# Patient Record
Sex: Male | Born: 1980 | Race: White | Hispanic: No | Marital: Married | Smoking: Current some day smoker
Health system: Southern US, Community
[De-identification: ages and names within clinical notes are randomized; demographics above are authoritative.]

## PROBLEM LIST (undated history)

## (undated) DIAGNOSIS — T7840XA Allergy, unspecified, initial encounter: Secondary | ICD-10-CM

## (undated) DIAGNOSIS — B019 Varicella without complication: Secondary | ICD-10-CM

## (undated) HISTORY — DX: Allergy, unspecified, initial encounter: T78.40XA

## (undated) HISTORY — DX: Varicella without complication: B01.9

---
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
## 2002-01-14 ENCOUNTER — Emergency Department (HOSPITAL_COMMUNITY): Admission: EM | Admit: 2002-01-14 | Discharge: 2002-01-14 | Payer: Self-pay | Admitting: Emergency Medicine

## 2003-12-06 ENCOUNTER — Emergency Department (HOSPITAL_COMMUNITY): Admission: EM | Admit: 2003-12-06 | Discharge: 2003-12-06 | Payer: Self-pay | Admitting: Emergency Medicine

## 2004-04-04 ENCOUNTER — Emergency Department (HOSPITAL_COMMUNITY): Admission: EM | Admit: 2004-04-04 | Discharge: 2004-04-04 | Payer: Self-pay | Admitting: Emergency Medicine

## 2006-06-07 ENCOUNTER — Emergency Department (HOSPITAL_COMMUNITY): Admission: EM | Admit: 2006-06-07 | Discharge: 2006-06-08 | Payer: Self-pay | Admitting: Emergency Medicine

## 2007-10-27 ENCOUNTER — Emergency Department (HOSPITAL_COMMUNITY): Admission: EM | Admit: 2007-10-27 | Discharge: 2007-10-27 | Payer: Self-pay | Admitting: Emergency Medicine

## 2008-05-05 ENCOUNTER — Emergency Department (HOSPITAL_COMMUNITY): Admission: EM | Admit: 2008-05-05 | Discharge: 2008-05-05 | Payer: Self-pay | Admitting: Emergency Medicine
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
## 2011-08-28 ENCOUNTER — Emergency Department (HOSPITAL_COMMUNITY)
Admission: EM | Admit: 2011-08-28 | Discharge: 2011-08-28 | Discharge status: Against Medical Advice | Payer: Self-pay | Attending: Emergency Medicine | Admitting: Emergency Medicine

## 2011-08-28 ENCOUNTER — Encounter (HOSPITAL_COMMUNITY): Payer: Self-pay | Admitting: *Deleted

## 2011-08-28 ENCOUNTER — Emergency Department (HOSPITAL_COMMUNITY)
Admission: EM | Admit: 2011-08-28 | Discharge: 2011-08-28 | Disposition: A | Discharge status: Home / Self Care | Payer: Medicaid Other | Attending: Emergency Medicine | Admitting: Emergency Medicine

## 2011-08-28 ENCOUNTER — Encounter (HOSPITAL_COMMUNITY): Payer: Self-pay

## 2011-08-28 DIAGNOSIS — K0381 Cracked tooth: Secondary | ICD-10-CM | POA: Insufficient documentation

## 2011-08-28 DIAGNOSIS — K089 Disorder of teeth and supporting structures, unspecified: Secondary | ICD-10-CM | POA: Insufficient documentation

## 2011-08-28 DIAGNOSIS — K0889 Other specified disorders of teeth and supporting structures: Secondary | ICD-10-CM

## 2011-08-28 MED ORDER — OXYCODONE-ACETAMINOPHEN 5-325 MG PO TABS: 1.0000 | ORAL_TABLET | ORAL | Status: AC | PRN

## 2011-08-28 MED ORDER — BUPIVACAINE HCL 0.25 % IJ SOLN
10.0000 mL | Freq: Once | INTRAMUSCULAR | Status: AC
  Administered 2011-08-28: 1 mL

## 2011-08-28 MED ORDER — IBUPROFEN 400 MG PO TABS
400.0000 mg | ORAL_TABLET | Freq: Once | ORAL | Status: AC
  Administered 2011-08-28: 400 mg via ORAL
  Filled 2011-08-28: qty 1

## 2011-08-28 MED ORDER — NAPROXEN 500 MG PO TABS: 500.0000 mg | ORAL_TABLET | Freq: Two times a day (BID) | ORAL | Status: AC | PRN

## 2011-08-28 NOTE — ED Notes (Signed)
Pt called to take to a treatment room  No response from lobby  Registration states person walked outside a while ago and never returned

## 2011-08-28 NOTE — ED Provider Notes (Signed)
History    This chart was scribed for Adam Razor, MD, MD by Smitty Pluck. The patient was seen in room TR07C and the patient's care was started at 12:34PM.   CSN: [id number]  Arrival date & time 08/28/11  1219   None     No chief complaint on file.   (Consider location/radiation/quality/duration/timing/severity/associated sxs/prior treatment) The history is provided by the patient.   Adam Kennedy is a 31 y.o. male who presents to the Emergency Department complaining of constant, moderate right lower dental pain onset 1 week ago. Pt reports that he has decayed tooth and wisdom tooth coming in. Denies trouble breathing and swallowing. Reports pain is radiating to right jaw and right ear. Pt has taken OTC pain medication without relief. Pt reports that he is the process of finding a dentist.   No past medical history on file.  No past surgical history on file.  No family history on file.  History  Substance Use Topics  . Smoking status: Current Everyday Smoker  . Smokeless tobacco: Not on file  . Alcohol Use: Yes      Review of Systems  All other systems reviewed and are negative.  10 Systems reviewed and all are negative for acute change except as noted in the HPI.   Allergies  Review of patient's allergies indicates no known allergies.  Home Medications  No current outpatient prescriptions on file.  BP 155/102  Pulse 75  Temp 97.7 F (36.5 C) (Oral)  Resp 20  Ht 6' (1.829 m)  Wt 145 lb (65.772 kg)  BMI 19.67 kg/m2  SpO2 98%  Physical Exam  Nursing note and vitals reviewed. Constitutional: He appears well-developed and well-nourished. No distress.  HENT:  Head: Normocephalic and atraumatic.  Mouth/Throat: Oropharynx is clear and moist.       Right lower wisdom tooth is impacted  Mild gingival hyperplasia without abscess Dental caries to right lower molars     Eyes: Conjunctivae are normal. Right eye exhibits no discharge. Left eye exhibits no  discharge.  Neck: Neck supple.  Cardiovascular: Normal rate, regular rhythm and normal heart sounds.  Exam reveals no gallop and no friction rub.   No murmur heard. Pulmonary/Chest: Effort normal and breath sounds normal. No respiratory distress.       No stridor   Abdominal: Soft. He exhibits no distension. There is no tenderness.  Musculoskeletal: He exhibits no edema and no tenderness.  Lymphadenopathy:    He has no cervical adenopathy.  Neurological: He is alert.  Skin: Skin is warm and dry.  Psychiatric: He has a normal mood and affect. His behavior is normal. Thought content normal.    ED Course  NERVE BLOCK Date/Time: 08/28/2011 4:45 AM Performed by: Adam Kennedy Authorized by: Adam Kennedy Consent: Verbal consent obtained. Risks and benefits: risks, benefits and alternatives were discussed Consent given by: patient Indications: pain relief Body area: face/mouth Nerve: inferior alveolar Laterality: right Patient sedated: no Needle gauge: 27 G Location technique: anatomical landmarks Local anesthetic: bupivacaine 0.5% without epinephrine Anesthetic total: 2 ml Outcome: pain improved Patient tolerance: Patient tolerated the procedure well with no immediate complications.   (including critical care time) DIAGNOSTIC STUDIES: Oxygen Saturation is 98% on room air, normal by my interpretation.    COORDINATION OF CARE: 12:42PM EDP discusses pt ED treatment with pt  12:42PM EDP orders medication: Scheduled Meds:   . bupivacaine  10 mL Infiltration Once  . ibuprofen  400 mg Oral Once   Continuous Infusions:  PRN Meds:.    Labs Reviewed - No data to display No results found.   1. Pain, dental       MDM  30yM with dental pain w/o evidence of infection. Plan symptomatic control. Dental resource list provided. outpt fu.      I personally preformed the services scribed in my presence. The recorded information has been reviewed and considered. Adam Razor, MD.    Adam Razor, MD 09/06/11 [phone]

## 2011-08-28 NOTE — ED Notes (Signed)
MD at bedside. 

## 2011-08-28 NOTE — ED Notes (Signed)
Toothache to right lower jaw for about a week now. Broken molar and wisdom tooth coming in per patient. Outer portion of molar broken.

## 2011-08-28 NOTE — ED Notes (Signed)
Pt presents to the ED with c/o toothache to right lower molar. Pt also c/o ear pain, and head on the right side of head. Pt denies fever, n/v/d, cough, or sore throat. Pt states he has wisdom teeth that are also coming in.

## 2014-11-23 ENCOUNTER — Encounter: Payer: Self-pay | Admitting: Internal Medicine

## 2014-11-23 ENCOUNTER — Ambulatory Visit (INDEPENDENT_AMBULATORY_CARE_PROVIDER_SITE_OTHER): Payer: 59 | Admitting: Internal Medicine

## 2014-11-23 VITALS — BP 142/90 | HR 75 | Temp 98.4°F | Ht 69.5 in | Wt 146.0 lb

## 2014-11-23 DIAGNOSIS — F39 Unspecified mood [affective] disorder: Secondary | ICD-10-CM

## 2014-11-23 DIAGNOSIS — J302 Other seasonal allergic rhinitis: Secondary | ICD-10-CM

## 2014-11-23 NOTE — Progress Notes (Signed)
Pre visit review using our clinic review tool, if applicable. No additional management support is needed unless otherwise documented below in the visit note. 

## 2014-11-23 NOTE — Patient Instructions (Signed)
Generalized Anxiety Disorder Generalized anxiety disorder (GAD) is a mental disorder. It interferes with life functions, including relationships, work, and school. GAD is different from normal anxiety, which everyone experiences at some point in their lives in response to specific life events and activities. Normal anxiety actually helps us prepare for and get through these life events and activities. Normal anxiety goes away after the event or activity is over.  GAD causes anxiety that is not necessarily related to specific events or activities. It also causes excess anxiety in proportion to specific events or activities. The anxiety associated with GAD is also difficult to control. GAD can vary from mild to severe. People with severe GAD can have intense waves of anxiety with physical symptoms (panic attacks).  SYMPTOMS The anxiety and worry associated with GAD are difficult to control. This anxiety and worry are related to many life events and activities and also occur more days than not for 6 months or longer. People with GAD also have three or more of the following symptoms (one or more in children):  Restlessness.   Fatigue.  Difficulty concentrating.   Irritability.  Muscle tension.  Difficulty sleeping or unsatisfying sleep. DIAGNOSIS GAD is diagnosed through an assessment by your health care provider. Your health care provider will ask you questions aboutyour mood,physical symptoms, and events in your life. Your health care provider may ask you about your medical history and use of alcohol or drugs, including prescription medicines. Your health care provider may also do a physical exam and blood tests. Certain medical conditions and the use of certain substances can cause symptoms similar to those associated with GAD. Your health care provider may refer you to a mental health specialist for further evaluation. TREATMENT The following therapies are usually used to treat GAD:    Medication. Antidepressant medication usually is prescribed for long-term daily control. Antianxiety medicines may be added in severe cases, especially when panic attacks occur.   Talk therapy (psychotherapy). Certain types of talk therapy can be helpful in treating GAD by providing support, education, and guidance. A form of talk therapy called cognitive behavioral therapy can teach you healthy ways to think about and react to daily life events and activities.  Stress managementtechniques. These include yoga, meditation, and exercise and can be very helpful when they are practiced regularly. A mental health specialist can help determine which treatment is best for you. Some people see improvement with one therapy. However, other people require a combination of therapies.   This information is not intended to replace advice given to you by your health care provider. Make sure you discuss any questions you have with your health care provider.   Document Released: 05/12/2012 Document Revised: 02/05/2014 Document Reviewed: 05/12/2012 Elsevier Interactive Patient Education 2016 [employer]  

## 2014-11-23 NOTE — Progress Notes (Signed)
HPI  Pt presents to the clinic today to establish care and for management of the conditions listed below. He has not had a PCP in many years.  He reports he is very irritable and has anger issues. He reports this has been an ongoing issue, > 10 years ago. He feels on edge all the time and feels like he may "go off" at any moment. He reports anxiety but denies depression. He denies SI/HI. He has never been treated for this in the past. He is concerned that he may be bipolar. He has not family history of documented mental illness.  Seasonal allergies: Worse in the Spring and Fall. He takes Claritin as needed.  Flu: never Tetanus: 2013 Dentist: as needed  Past Medical History  Diagnosis Date  . Chicken pox   . Allergy     No current outpatient prescriptions on file.   No current facility-administered medications for this visit.    No Known Allergies  Family History  Problem Relation Age of Onset  . Hypertension Mother   . Colon cancer Father   . Lung cancer Father   . Arthritis Maternal Grandmother   . Hypertension Maternal Grandmother   . Arthritis Maternal Grandfather   . Arthritis Paternal Grandmother   . Arthritis Paternal Grandfather     Social History   Social History  . Marital Status: Married    Spouse Name: N/A  . Number of Children: N/A  . Years of Education: N/A   Occupational History  . Not on file.   Social History Main Topics  . Smoking status: Current Every Day Smoker -- 1.00 packs/day  . Smokeless tobacco: Not on file  . Alcohol Use: 0.0 oz/week    0 Standard drinks or equivalent per week     Comment: daily beer  . Drug Use: Yes    Special: Marijuana     Comment: occasional  . Sexual Activity: Not on file     Comment: 08/27/11   Other Topics Concern  . Not on file   Social History Narrative    ROS:  Constitutional: Denies fever, malaise, fatigue, headache or abrupt weight changes.  HEENT: Denies eye pain, eye redness, ear pain, ringing  in the ears, wax buildup, runny nose, nasal congestion, bloody nose, or sore throat. Respiratory: Denies difficulty breathing, shortness of breath, cough or sputum production.   Cardiovascular: Denies chest pain, chest tightness, palpitations or swelling in the hands or feet.  Neurological: Denies dizziness, difficulty with memory, difficulty with speech or problems with balance and coordination.  Psych: Pt reports anxiety. Denies depression, SI/HI.  No other specific complaints in a complete review of systems (except as listed in HPI above).  PE:  BP 142/90 mmHg  Pulse 75  Temp(Src) 98.4 F (36.9 C) (Oral)  Ht 5' 9.5" (1.765 m)  Wt 146 lb (66.225 kg)  BMI 21.26 kg/m2  SpO2 98% Wt Readings from Last 3 Encounters:  11/23/14 146 lb (66.225 kg)  08/28/11 145 lb (65.772 kg)  08/28/11 145 lb (65.772 kg)    General: Appears his stated age, well developed, well nourished in NAD. HEENT: Head: normal shape and size; Eyes: sclera white, no icterus, conjunctiva pink, PERRLA and EOMs intact; Ears: Tm's gray and intact, normal light reflex;Throat/Mouth: Teeth present, mucosa pink and moist, no lesions or ulcerations noted.  Cardiovascular: Tachycardic with normal rhythm. S1,S2 noted.  No murmur, rubs or gallops noted.  Pulmonary/Chest: Normal effort and positive vesicular breath sounds. No respiratory distress. No wheezes,  rales or ronchi noted.  Neurological: Alert and oriented.  Psychiatric: Mood and affect normal. Behavior is normal. Judgment and thought content normal.    Assessment and Plan:  Mood disorder:  Will refer to psychiatry for further evaluation and treatment  Seasonal allergies:  Continue Claritin prn  Make an appt for your annual exam

## 2015-01-12 ENCOUNTER — Encounter (HOSPITAL_COMMUNITY): Payer: Self-pay | Admitting: Psychiatry

## 2015-01-12 ENCOUNTER — Ambulatory Visit (INDEPENDENT_AMBULATORY_CARE_PROVIDER_SITE_OTHER): Payer: 59 | Admitting: Psychiatry

## 2015-01-12 ENCOUNTER — Encounter (INDEPENDENT_AMBULATORY_CARE_PROVIDER_SITE_OTHER): Payer: Self-pay

## 2015-01-12 VITALS — BP 136/80 | HR 64 | Ht 70.0 in | Wt 150.2 lb

## 2015-01-12 DIAGNOSIS — F319 Bipolar disorder, unspecified: Secondary | ICD-10-CM

## 2015-01-12 DIAGNOSIS — F1210 Cannabis abuse, uncomplicated: Secondary | ICD-10-CM

## 2015-01-12 MED ORDER — DIVALPROEX SODIUM ER 250 MG PO TB24: ORAL_TABLET | ORAL | Status: AC
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
## 2015-02-10 ENCOUNTER — Ambulatory Visit (HOSPITAL_COMMUNITY): Payer: Self-pay | Admitting: Psychiatry

## 2015-05-24 ENCOUNTER — Encounter: Payer: 59 | Admitting: Internal Medicine

## 2016-11-21 ENCOUNTER — Emergency Department (HOSPITAL_COMMUNITY)
Admission: EM | Admit: 2016-11-21 | Discharge: 2016-11-21 | Disposition: A | Discharge status: Home / Self Care | Payer: BC Managed Care – PPO | Attending: Emergency Medicine | Admitting: Emergency Medicine

## 2016-11-21 ENCOUNTER — Encounter (HOSPITAL_COMMUNITY): Payer: Self-pay | Admitting: *Deleted

## 2016-11-21 DIAGNOSIS — R197 Diarrhea, unspecified: Secondary | ICD-10-CM | POA: Insufficient documentation

## 2016-11-21 DIAGNOSIS — F17200 Nicotine dependence, unspecified, uncomplicated: Secondary | ICD-10-CM | POA: Insufficient documentation

## 2016-11-21 DIAGNOSIS — K625 Hemorrhage of anus and rectum: Secondary | ICD-10-CM | POA: Insufficient documentation

## 2016-11-21 LAB — COMPREHENSIVE METABOLIC PANEL
ALT: 19 U/L (ref 17–63)
ANION GAP: 11 (ref 5–15)
AST: 25 U/L (ref 15–41)
Albumin: 4.5 g/dL (ref 3.5–5.0)
Alkaline Phosphatase: 75 U/L (ref 38–126)
BUN: 6 mg/dL (ref 6–20)
CHLORIDE: 103 mmol/L (ref 101–111)
CO2: 26 mmol/L (ref 22–32)
Calcium: 9.3 mg/dL (ref 8.9–10.3)
Creatinine, Ser: 0.93 mg/dL (ref 0.61–1.24)
GFR calc non Af Amer: >60 mL/min (ref >60)
Glucose, Bld: 94 mg/dL (ref 65–99)
POTASSIUM: 3.7 mmol/L (ref 3.5–5.1)
SODIUM: 140 mmol/L (ref 135–145)
Total Bilirubin: 0.7 mg/dL (ref 0.3–1.2)
Total Protein: 7.5 g/dL (ref 6.5–8.1)

## 2016-11-21 LAB — CBC
HCT: 52.6 % — ABNORMAL HIGH (ref 39.0–52.0)
HEMOGLOBIN: 17.8 g/dL — AB (ref 13.0–17.0)
MCH: 33.1 pg (ref 26.0–34.0)
MCHC: 33.8 g/dL (ref 30.0–36.0)
MCV: 98 fL (ref 78.0–100.0)
PLATELETS: 257 10*3/uL (ref 150–400)
RBC: 5.37 MIL/uL (ref 4.22–5.81)
RDW: 13 % (ref 11.5–15.5)
WBC: 9.1 10*3/uL (ref 4.0–10.5)

## 2016-11-21 LAB — POC OCCULT BLOOD, ED: FECAL OCCULT BLD: NEGATIVE

## 2016-11-21 NOTE — Discharge Instructions (Signed)
It was our pleasure to provide your ER care today - we hope that you feel better.  From today's lab tests, your hemoglobin (blood count) is normal, and your blood tests negative for blood.  Given your symptoms, and family history, follow up with GI doctor in the next few weeks - see referral  - call office to arrange appointment.   Also follow up with primary care doctor.   Return to ER if worse, new symptoms, recurrent or heavy bleeding, severe abdominal pain, other concern.

## 2016-11-21 NOTE — ED Provider Notes (Signed)
[facility] EMERGENCY DEPARTMENT Provider Note   CSN: [id number] Arrival date & time: 11/21/16  2505     History   Chief Complaint Chief Complaint  Patient presents with  . Blood In Stools  . Diarrhea    HPI Adam Kennedy is a 36 y.o. male.  Patient c/o rectal bleeding this AM.  States had a stool with a few cm area of bright red blood. No hx same. States at his baseline, for as long as he can remember, has loose bms, 1-2 per day. Denies severe diarrhea. No abd pain. No rectal trauma. States he is worried as his father got colon ca at an early age. Denies other abnormal bruising or bleeding. No nsaid, asa, or anticoag use. No faintness or dizziness. No hx melena. No fever or chills.    The history is provided by the patient.  Diarrhea   Pertinent negatives include no abdominal pain.    Past Medical History:  Diagnosis Date  . Allergy   . Chicken pox     There are no active problems to display for this patient.   History reviewed. No pertinent surgical history.     Home Medications    Prior to Admission medications   Medication Sig Start Date End Date Taking? Authorizing Provider  divalproex (DEPAKOTE ER) 250 MG 24 hr tablet Take 1 tab adily for 1 week and than 2 tab at bed time 01/12/15   Arfeen, Arlyce Harman, MD    Family History Family History  Problem Relation Age of Onset  . Hypertension Mother   . Colon cancer Father   . Lung cancer Father   . Arthritis Maternal Grandmother   . Hypertension Maternal Grandmother   . Arthritis Maternal Grandfather   . Arthritis Paternal Grandmother   . Arthritis Paternal Grandfather     Social History Social History  Substance Use Topics  . Smoking status: Current Some Day Smoker    Packs/day: 1.00    Years: 20.00  . Smokeless tobacco: Never Used  . Alcohol use 4.8 oz/week    8 Cans of beer per week     Comment: daily beer     Allergies   Patient has no known allergies.   Review of  Systems Review of Systems  Constitutional: Negative for fever.  HENT: Negative for nosebleeds.   Eyes: Negative for redness.  Respiratory: Negative for shortness of breath.   Cardiovascular: Negative for chest pain.  Gastrointestinal: Positive for blood in stool and diarrhea. Negative for abdominal pain.  Genitourinary: Negative for hematuria.  Musculoskeletal: Negative for back pain.  Skin: Negative for rash.  Neurological: Negative for dizziness and light-headedness.  Hematological: Does not bruise/bleed easily.  Psychiatric/Behavioral: Negative for confusion.     Physical Exam Updated Vital Signs BP (!) 137/101 (BP Location: Right Arm)   Pulse 70   Temp 97.6 F (36.4 C) (Oral)   Resp 14   Ht 1.803 m (_0 )   Wt 68 kg (150 lb)   SpO2 99%   BMI 20.92 kg/m   Physical Exam  Constitutional: He appears well-developed and well-nourished. No distress.  HENT:  Head: Atraumatic.  Eyes: Conjunctivae are normal.  Neck: Neck supple. No tracheal deviation present.  Cardiovascular: Normal rate.   Pulmonary/Chest: Effort normal. No accessory muscle usage. No respiratory distress.  Abdominal: Soft. Bowel sounds are normal. He exhibits no distension and no mass. There is no tenderness. There is no guarding.  Genitourinary:  Genitourinary Comments: Rectal exam,  no mass felt. Dark brown stool - sent for hemoccult testing.   Musculoskeletal: He exhibits no edema.  Neurological: He is alert.  Skin: Skin is warm and dry. No rash noted. He is not diaphoretic.  Psychiatric: He has a normal mood and affect.  Nursing note and vitals reviewed.    ED Treatments / Results  Labs (all labs ordered are listed, but only abnormal results are displayed) Results for orders placed or performed during the hospital encounter of 11/21/16  Comprehensive metabolic panel  Result Value Ref Range   Sodium 140 135 - 145 mmol/L   Potassium 3.7 3.5 - 5.1 mmol/L   Chloride 103 101 - 111 mmol/L   CO2 26  22 - 32 mmol/L   Glucose, Bld 94 65 - 99 mg/dL   BUN 6 6 - 20 mg/dL   Creatinine, Ser 0.93 0.61 - 1.24 mg/dL   Calcium 9.3 8.9 - 10.3 mg/dL   Total Protein 7.5 6.5 - 8.1 g/dL   Albumin 4.5 3.5 - 5.0 g/dL   AST 25 15 - 41 U/L   ALT 19 17 - 63 U/L   Alkaline Phosphatase 75 38 - 126 U/L   Total Bilirubin 0.7 0.3 - 1.2 mg/dL   GFR calc non Af Amer >60 >60 mL/min   GFR calc Af Amer >60 >60 mL/min   Anion gap 11 5 - 15  CBC  Result Value Ref Range   WBC 9.1 4.0 - 10.5 K/uL   RBC 5.37 4.22 - 5.81 MIL/uL   Hemoglobin 17.8 (H) 13.0 - 17.0 g/dL   HCT 52.6 (H) 39.0 - 52.0 %   MCV 98.0 78.0 - 100.0 fL   MCH 33.1 26.0 - 34.0 pg   MCHC 33.8 30.0 - 36.0 g/dL   RDW 13.0 11.5 - 15.5 %   Platelets 257 150 - 400 K/uL   EKG  EKG Interpretation None       Radiology No results found.  Procedures Procedures (including critical care time)  Medications Ordered in ED Medications - No data to display   Initial Impression / Assessment and Plan / ED Course  I have reviewed the triage vital signs and the nursing notes.  Pertinent labs & imaging results that were available during my care of the patient were reviewed by me and considered in my medical decision making (see chart for details).  Labs sent.  Hgb normal. Bun 6.   abd soft nt.   Stool brown in color.  Pt very anxious about fam/dad hx of met colon ca at very early age.  Will refer to gi f/u, possible colonoscopy as outpt.     Final Clinical Impressions(s) / ED Diagnoses   Final diagnoses:  None    New Prescriptions New Prescriptions   No medications on file     Lajean Saver, MD 11/21/16 1018

## 2016-11-21 NOTE — ED Triage Notes (Signed)
Pt states nickle size clot in his stool today with bright red blood.  States hx of "diarrhea like stools for a long time". Denies abdominal pain.
